# Patient Record
Sex: Male | Born: 1985 | Hispanic: No | Smoking: Never smoker
Health system: Southern US, Community
[De-identification: ages and names within clinical notes are randomized; demographics above are authoritative.]

## PROBLEM LIST (undated history)

## (undated) HISTORY — PX: EYE SURGERY: SHX253

---

## 2008-08-17 ENCOUNTER — Emergency Department (HOSPITAL_COMMUNITY): Admission: EM | Admit: 2008-08-17 | Discharge: 2008-08-17 | Payer: Self-pay | Admitting: Emergency Medicine

## 2011-07-13 LAB — RAPID URINE DRUG SCREEN, HOSP PERFORMED
Barbiturates: NOT DETECTED
Benzodiazepines: NOT DETECTED
Tetrahydrocannabinol: NOT DETECTED

## 2011-07-13 LAB — BASIC METABOLIC PANEL
BUN: 12
CO2: 19
Calcium: 9.4
Chloride: 102
GFR calc Af Amer: >60
Glucose, Bld: 156 — ABNORMAL HIGH
Potassium: 3 — ABNORMAL LOW

## 2011-07-13 LAB — POCT CARDIAC MARKERS: Troponin i, poc: <0.05

## 2012-12-03 ENCOUNTER — Ambulatory Visit (INDEPENDENT_AMBULATORY_CARE_PROVIDER_SITE_OTHER): Payer: 59 | Admitting: Family Medicine

## 2012-12-03 VITALS — BP 136/83 | HR 67 | Temp 98.5°F | Resp 16 | Ht 72.0 in | Wt 254.0 lb

## 2012-12-03 DIAGNOSIS — M25562 Pain in left knee: Secondary | ICD-10-CM

## 2012-12-03 DIAGNOSIS — M25569 Pain in unspecified knee: Secondary | ICD-10-CM

## 2012-12-03 MED ORDER — MELOXICAM 7.5 MG PO TABS: 7.5000 mg | ORAL_TABLET | Freq: Every day | ORAL | Status: DC

## 2012-12-03 NOTE — Patient Instructions (Signed)
Your knee pain is likely due to patellofemoral pain syndrome, or "runner's knee".  See the handout for exercises, and can try the meloxicam - 1 per day.  Recheck in the next few weeks if not improving for possible x ray or other evaluation. Return to the clinic or go to the nearest emergency room if any of your symptoms worsen or new symptoms occur.

## 2012-12-03 NOTE — Progress Notes (Signed)
  Subjective:    Patient ID: Stephen Hayes, male    DOB: 07-Mar-1986, 27 y.o.   MRN: [id number]  HPI Alexzavier Smaldone is a 27 y.o. male  L knee pain for past few weeks.  Some injuries with soccer about 10 years ago, but no surgery.  Heard popping at times with exteding at times.  No known recent injury, just sore at times - notes popping after sitting for awhile, ar seated in cars for awhile.  Occasional tylenol, but pain goes away on own..  Feels ok to run, but notes soreness after running.   Has felt locking sx's at times - few days ago after running - then resolved on own.   Tx: No regular use of meds - tried icy hot and wrap.   Review of Systems  Constitutional: Negative for fever and chills.  Musculoskeletal: Positive for arthralgias. Negative for joint swelling.  Skin: Negative for rash and wound.  Neurological: Negative for weakness.       Objective:   Physical Exam  Vitals reviewed. Constitutional: He is oriented to person, place, and time. He appears well-developed. No distress.  Pulmonary/Chest: Effort normal.  Musculoskeletal: Normal range of motion.       Left knee: He exhibits abnormal patellar mobility (slight j sign - noted left and right, with slight decreased VMO bulk. ). He exhibits normal range of motion, no swelling, no effusion, no ecchymosis, no deformity and normal meniscus. No medial joint line, no lateral joint line and no patellar tendon (minimal at patellar tendon. ) tenderness noted.       Legs: Neurological: He is alert and oriented to person, place, and time.  nvi distally.  Skin: Skin is warm, dry and intact. No rash noted. No erythema.  Psychiatric: He has a normal mood and affect. His behavior is normal.      Assessment & Plan:  Selso Mannor is a 27 y.o. male Patellofemoral pain syndrome, left - Plan: meloxicam (MOBIC) 7.5 MG tablet  Knee pain, left anterior - Plan: meloxicam (MOBIC) 7.5 MG tablet  Intermittent, recurrent L knee pain. Exam and sx;s  consistent with PFPS.  Will try HEP per sports med database, mobic once  Per day and recheck in next few weeks if not improving.  rtc precautions discussed.   Patient Instructions  Your knee pain is likely due to patellofemoral pain syndrome, or "runner's knee".  See the handout for exercises, and can try the meloxicam - 1 per day.  Recheck in the next few weeks if not improving for possible x ray or other evaluation. Return to the clinic or go to the nearest emergency room if any of your symptoms worsen or new symptoms occur.    Meds ordered this encounter  Medications  . meloxicam (MOBIC) 7.5 MG tablet    Sig: Take 1 tablet (7.5 mg total) by mouth daily.    Dispense:  30 tablet    Refill:  0

## 2013-01-16 ENCOUNTER — Ambulatory Visit (INDEPENDENT_AMBULATORY_CARE_PROVIDER_SITE_OTHER): Payer: 59 | Admitting: Family Medicine

## 2013-01-16 ENCOUNTER — Ambulatory Visit: Payer: 59

## 2013-01-16 VITALS — BP 132/80 | HR 71 | Temp 98.7°F | Resp 18 | Ht 70.75 in | Wt 246.0 lb

## 2013-01-16 DIAGNOSIS — M549 Dorsalgia, unspecified: Secondary | ICD-10-CM

## 2013-01-16 DIAGNOSIS — IMO0002 Reserved for concepts with insufficient information to code with codable children: Secondary | ICD-10-CM

## 2013-01-16 DIAGNOSIS — S39012A Strain of muscle, fascia and tendon of lower back, initial encounter: Secondary | ICD-10-CM

## 2013-01-16 DIAGNOSIS — M79609 Pain in unspecified limb: Secondary | ICD-10-CM

## 2013-01-16 MED ORDER — DICLOFENAC SODIUM 75 MG PO TBEC: 75.0000 mg | DELAYED_RELEASE_TABLET | Freq: Two times a day (BID) | ORAL | Status: DC

## 2013-01-16 MED ORDER — METHYLPREDNISOLONE 4 MG PO KIT: PACK | ORAL | Status: DC

## 2013-01-16 MED ORDER — CYCLOBENZAPRINE HCL 5 MG PO TABS: 5.0000 mg | ORAL_TABLET | Freq: Every evening | ORAL | Status: DC | PRN

## 2013-01-16 MED ORDER — TRAMADOL HCL 50 MG PO TABS: 50.0000 mg | ORAL_TABLET | Freq: Three times a day (TID) | ORAL | Status: DC | PRN

## 2013-01-16 NOTE — Progress Notes (Signed)
[facility]:  Office Visit  Chief Complaint:  Chief Complaint  Patient presents with  . Back Pain    lower; since wednesday    HPI: Stephen Hayes is a 27 y.o. male who complains of  1 week history of right lower back after playing basketball. Was going for a ball and may have pulled something. Tried aleve, ibuprofen, massage without relief. Has had back problems 10 years ago, had torn msk tissue. When he sits and tries to get up he has sharp pain and feels back is being pinched. When he does physical activity he feels fine, usually after the activity he has pain. Sharp 8/10 pain. No weakness, numbness, tingling.   History reviewed. No pertinent past medical history. History reviewed. No pertinent past surgical history. History   Social History  . Marital Status: Unknown    Spouse Name: N/A    Number of Children: N/A  . Years of Education: N/A   Social History Main Topics  . Smoking status: Never Smoker   . Smokeless tobacco: None  . Alcohol Use: Yes  . Drug Use: No  . Sexually Active: Yes   Other Topics Concern  . None   Social History Narrative  . None   History reviewed. No pertinent family history. No Known Allergies Prior to Admission medications   Medication Sig Start Date End Date Taking? Authorizing Provider  ibuprofen (ADVIL,MOTRIN) 200 MG tablet Take 200 mg by mouth every 6 (six) hours as needed for pain.   Yes Historical Provider, MD  meloxicam (MOBIC) 7.5 MG tablet Take 1 tablet (7.5 mg total) by mouth daily. 12/03/12   Shade Flood, MD     ROS: The patient denies fevers, chills, night sweats, unintentional weight loss, chest pain, palpitations, wheezing, dyspnea on exertion, nausea, vomiting, abdominal pain, dysuria, hematuria, melena, numbness, weakness, or tingling.   All other systems have been reviewed and were otherwise negative with the exception of those mentioned in the HPI and as above.    PHYSICAL EXAM: Filed Vitals:    01/16/13 1607  BP: 132/80  Pulse: 71  Temp: 98.7 F (37.1 C)  Resp: 18   Filed Vitals:   01/16/13 1607  Height: 5' 10.75" (1.797 m)  Weight: 246 lb (111.585 kg)   Body mass index is 34.55 kg/(m^2).  General: Alert, no acute distress HEENT:  Normocephalic, atraumatic, oropharynx patent.  Cardiovascular:  Regular rate and rhythm, no rubs murmurs or gallops.  No Carotid bruits, radial pulse intact. No pedal edema.  Respiratory: Clear to auscultation bilaterally.  No wheezes, rales, or rhonchi.  No cyanosis, no use of accessory musculature GI: No organomegaly, abdomen is soft and non-tender, positive bowel sounds.  No masses. Skin: No rashes. Neurologic: Facial musculature symmetric. Psychiatric: Patient is appropriate throughout our interaction. Lymphatic: No cervical lymphadenopathy Musculoskeletal: Gait intact. Tender right side + straight leg right side Decrease ROM in extension, SB, Rotation   LABS: Results for orders placed during the hospital encounter of 08/17/08  ETHANOL      Result Value Range   Alcohol, Ethyl (B)   (*)    Value: 13            LOWEST DETECTABLE LIMIT FOR     SERUM ALCOHOL IS 11 mg/dL     FOR MEDICAL PURPOSES ONLY  BASIC METABOLIC PANEL      Result Value Range   Sodium 135     Potassium 3.0 (*)    Chloride 102  CO2 19     Glucose, Bld 156 (*)    BUN 12     Creatinine, Ser 1.15     Calcium 9.4     GFR calc non Af Amer >60     GFR calc Af Amer       Value: >60            The eGFR has been calculated     using the MDRD equation.     This calculation has not been     validated in all clinical  URINE RAPID DRUG SCREEN (HOSP PERFORMED)      Result Value Range   Opiates NONE DETECTED     Cocaine NONE DETECTED     Benzodiazepines NONE DETECTED     Amphetamines NONE DETECTED     Tetrahydrocannabinol NONE DETECTED     Barbiturates       Value: NONE DETECTED            DRUG SCREEN FOR MEDICAL PURPOSES     ONLY.  IF CONFIRMATION IS NEEDED      FOR ANY PURPOSE, NOTIFY LAB     WITHIN 5 DAYS.  POCT CARDIAC MARKERS      Result Value Range   Myoglobin, poc 48.5     CKMB, poc <1.0 (*)    Troponin i, poc <0.05       EKG/XRAY:   Primary read interpreted by Dr. Conley Rolls at [facility]. No fx/dislocation    ASSESSMENT/PLAN: Encounter Diagnoses  Name Primary?  . Back pain Yes  . Sprain and strain of back, initial encounter    Rx Flexeril, Tramadol, Diclofenac Medrol dose pack if no better in 2 days F/u in 14 days    Zelie Asbill PHUONG, DO 01/16/2013 5:57 PM

## 2013-01-16 NOTE — Patient Instructions (Addendum)
Back Exercises Back exercises help treat and prevent back injuries. The goal is to increase your strength in your belly (abdominal) and back muscles. These exercises can also help with flexibility. Start these exercises when told by your doctor. HOME CARE Back exercises include: Pelvic Tilt.  Lie on your back with your knees bent. Tilt your pelvis until the lower part of your back is against the floor. Hold this position 5 to 10 sec. Repeat this exercise 5 to 10 times. Knee to Chest.  Pull 1 knee up against your chest and hold for 20 to 30 seconds. Repeat this with the other knee. This may be done with the other leg straight or bent, whichever feels better. Then, pull both knees up against your chest. Sit-Ups or Curl-Ups.  Bend your knees 90 degrees. Start with tilting your pelvis, and do a partial, slow sit-up. Only lift your upper half 30 to 45 degrees off the floor. Take at least 2 to 3 seonds for each sit-up. Do not do sit-ups with your knees out straight. If partial sit-ups are difficult, simply do the above but with only tightening your belly (abdominal) muscles and holding it as told. Hip-Lift.  Lie on your back with your knees flexed 90 degrees. Push down with your feet and shoulders as you raise your hips 2 inches off the floor. Hold for 10 seconds, repeat 5 to 10 times. Back Arches.  Lie on your stomach. Prop yourself up on bent elbows. Slowly press on your hands, causing an arch in your low back. Repeat 3 to 5 times. Shoulder-Lifts.  Lie face down with arms beside your body. Keep hips and belly pressed to floor as you slowly lift your head and shoulders off the floor. Do not overdo your exercises. Be careful in the beginning. Exercises may cause you some mild back discomfort. If the pain lasts for more than 15 minutes, stop the exercises until you see your doctor. Improvement with exercise for back problems is slow.  Document Released: 10/30/2010 Document Revised: 12/20/2011  Document Reviewed: 07/29/2011 [facility] Patient Information 2013 [place], [place]. Back Pain, Adult Back pain is very common. The pain often gets better over time. The cause of back pain is usually not dangerous. Most people can learn to manage their back pain on their own.  HOME CARE   Stay active. Start with short walks on flat ground if you can. Try to walk farther each day.  Do not sit, drive, or stand in one place for more than 30 minutes. Do not stay in bed.  Do not avoid exercise or work. Activity can help your back heal faster.  Be careful when you bend or lift an object. Bend at your knees, keep the object close to you, and do not twist.  Sleep on a firm mattress. Lie on your side, and bend your knees. If you lie on your back, put a pillow under your knees.  Only take medicines as told by your doctor.  Put ice on the injured area.  Put ice in a plastic bag.  Place a towel between your skin and the bag.  Leave the ice on for 15 to 20 minutes, 3 to 4 times a day for the first 2 to 3 days. After that, you can switch between ice and heat packs.  Ask your doctor about back exercises or massage.  Avoid feeling anxious or stressed. Find good ways to deal with stress, such as exercise. GET HELP RIGHT AWAY IF:   Your  pain does not go away with rest or medicine.  Your pain does not go away in 1 week.  You have new problems.  You do not feel well.  The pain spreads into your legs.  You cannot control when you poop (bowel movement) or pee (urinate).  Your arms or legs feel weak or lose feeling (numbness).  You feel sick to your stomach (nauseous) or throw up (vomit).  You have belly (abdominal) pain.  You feel like you may pass out (faint). MAKE SURE YOU:   Understand these instructions.  Will watch your condition.  Will get help right away if you are not doing well or get worse. Document Released: 03/15/2008 Document Revised: 12/20/2011 Document Reviewed:  02/15/2011 [facility] Patient Information 2013 [place], [place].

## 2013-04-04 ENCOUNTER — Ambulatory Visit (INDEPENDENT_AMBULATORY_CARE_PROVIDER_SITE_OTHER): Payer: 59 | Admitting: Physician Assistant

## 2013-04-04 VITALS — BP 120/80 | HR 94 | Temp 99.9°F | Resp 16 | Ht 73.0 in | Wt 242.6 lb

## 2013-04-04 DIAGNOSIS — J029 Acute pharyngitis, unspecified: Secondary | ICD-10-CM

## 2013-04-04 DIAGNOSIS — R509 Fever, unspecified: Secondary | ICD-10-CM

## 2013-04-04 LAB — POCT RAPID STREP A (OFFICE): Rapid Strep A Screen: NEGATIVE

## 2013-04-04 MED ORDER — AMOXICILLIN 875 MG PO TABS: 875.0000 mg | ORAL_TABLET | Freq: Two times a day (BID) | ORAL | Status: DC

## 2013-04-04 MED ORDER — FIRST-DUKES MOUTHWASH MT SUSP: 5.0000 mL | OROMUCOSAL | Status: DC | PRN

## 2013-04-04 NOTE — Progress Notes (Signed)
  Subjective:    Patient ID: Stephen Hayes, male    DOB: [birth year], 27 y.o.   MRN: [id number]  HPI 27 year old male presents with 3 day history of sore throat that has progressively gotten worse over the last 24-48 hours.  Complains of chills, weakness, and body aches.  Denies nasal congestion, cough, headache, otalgia, emesis, or SOB, or wheezing.  Has had slight nausea and decreased appetite but no emesis.  Complains of pain with swallowing but is able to swallow.  Patient is otherwise healthy with no other concerns today .    Review of Systems  Constitutional: Positive for fever and chills.  HENT: Positive for sore throat. Negative for ear pain, congestion, rhinorrhea, trouble swallowing and postnasal drip.   Respiratory: Negative for cough.   Gastrointestinal: Negative for nausea, vomiting and abdominal pain.  Neurological: Negative for dizziness and headaches.       Objective:   Physical Exam  Constitutional: He is oriented to person, place, and time. He appears well-developed and well-nourished.  HENT:  Head: Normocephalic and atraumatic.  Right Ear: Hearing, tympanic membrane, external ear and ear canal normal.  Left Ear: Hearing, tympanic membrane, external ear and ear canal normal.  Mouth/Throat: Uvula is midline and mucous membranes are normal. Oropharyngeal exudate and posterior oropharyngeal erythema present. No posterior oropharyngeal edema (2+ tonsillar swelling bilaterally).  Eyes: Conjunctivae are normal.  Neck: Normal range of motion. Neck supple.  Cardiovascular: Normal rate, regular rhythm and normal heart sounds.   Pulmonary/Chest: Effort normal and breath sounds normal.  Lymphadenopathy:    He has no cervical adenopathy.  Neurological: He is alert and oriented to person, place, and time.  Psychiatric: He has a normal mood and affect. His behavior is normal. Judgment and thought content normal.    Results for orders placed in visit on 04/04/13  POCT RAPID STREP  A (OFFICE)      Result Value Range   Rapid Strep A Screen Negative  Negative        Assessment & Plan:  Acute pharyngitis - Plan: POCT rapid strep A, Culture, Group A Strep, amoxicillin (AMOXIL) 875 MG tablet, Diphenhyd-Hydrocort-Nystatin (FIRST-DUKES MOUTHWASH) SUSP  Fever, unspecified Throat culture Despite negative strep today, I am highly suspicious of strep pharyngitis Start amoxicillin 875 mg bid x 10 days Out of work today. Ok to go back tomorrow if afebrile Increase fluids and rest Follow up if symptoms worsen or fail to improve.

## 2013-04-06 LAB — CULTURE, GROUP A STREP: Organism ID, Bacteria: NORMAL

## 2014-01-01 ENCOUNTER — Other Ambulatory Visit: Payer: Self-pay | Admitting: Internal Medicine

## 2014-01-01 MED ORDER — SIMETHICONE 40 MG/0.6ML PO SUSP: 40.0000 mg | Freq: Every day | ORAL | Status: DC

## 2014-06-11 IMAGING — CR DG LUMBAR SPINE 2-3V
2 series · 2 of 2 positions shown · non-contrast
Comparison: None.

CLINICAL DATA: Low back pain

LUMBAR SPINE - 2-3 VIEW

[AP]
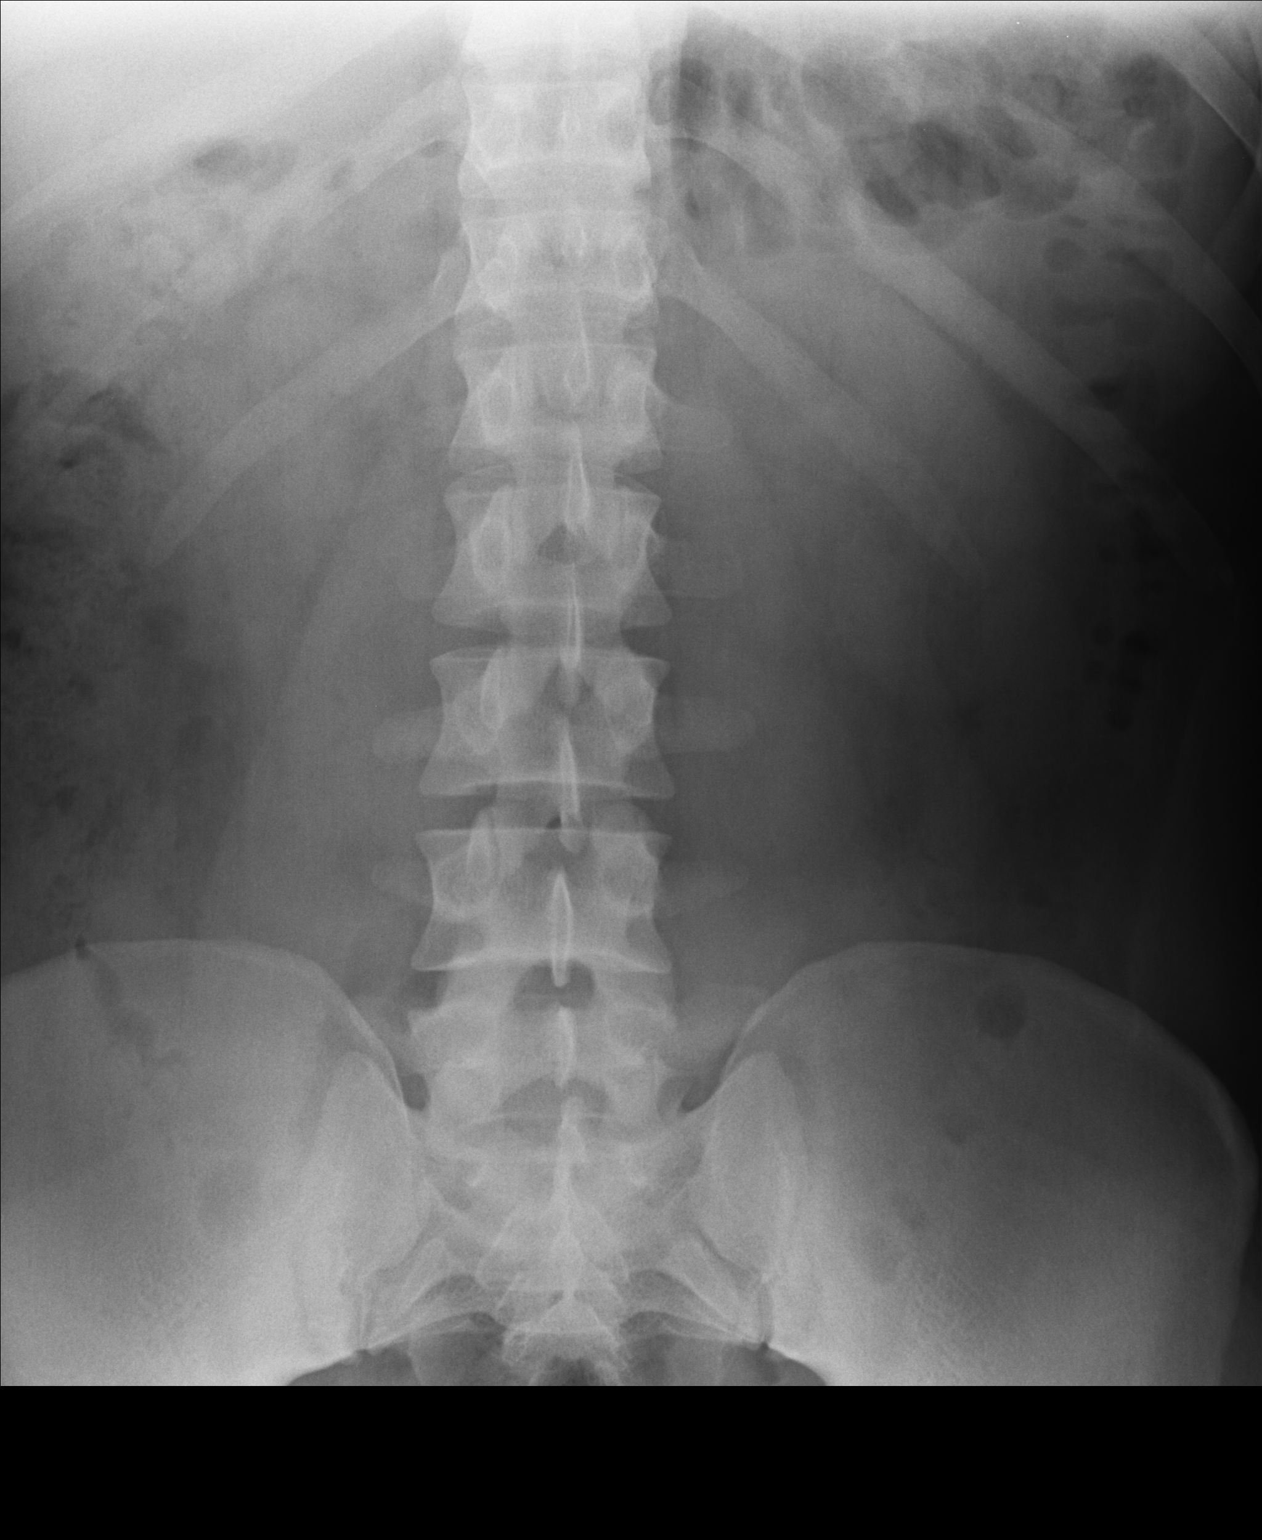

[lateral]
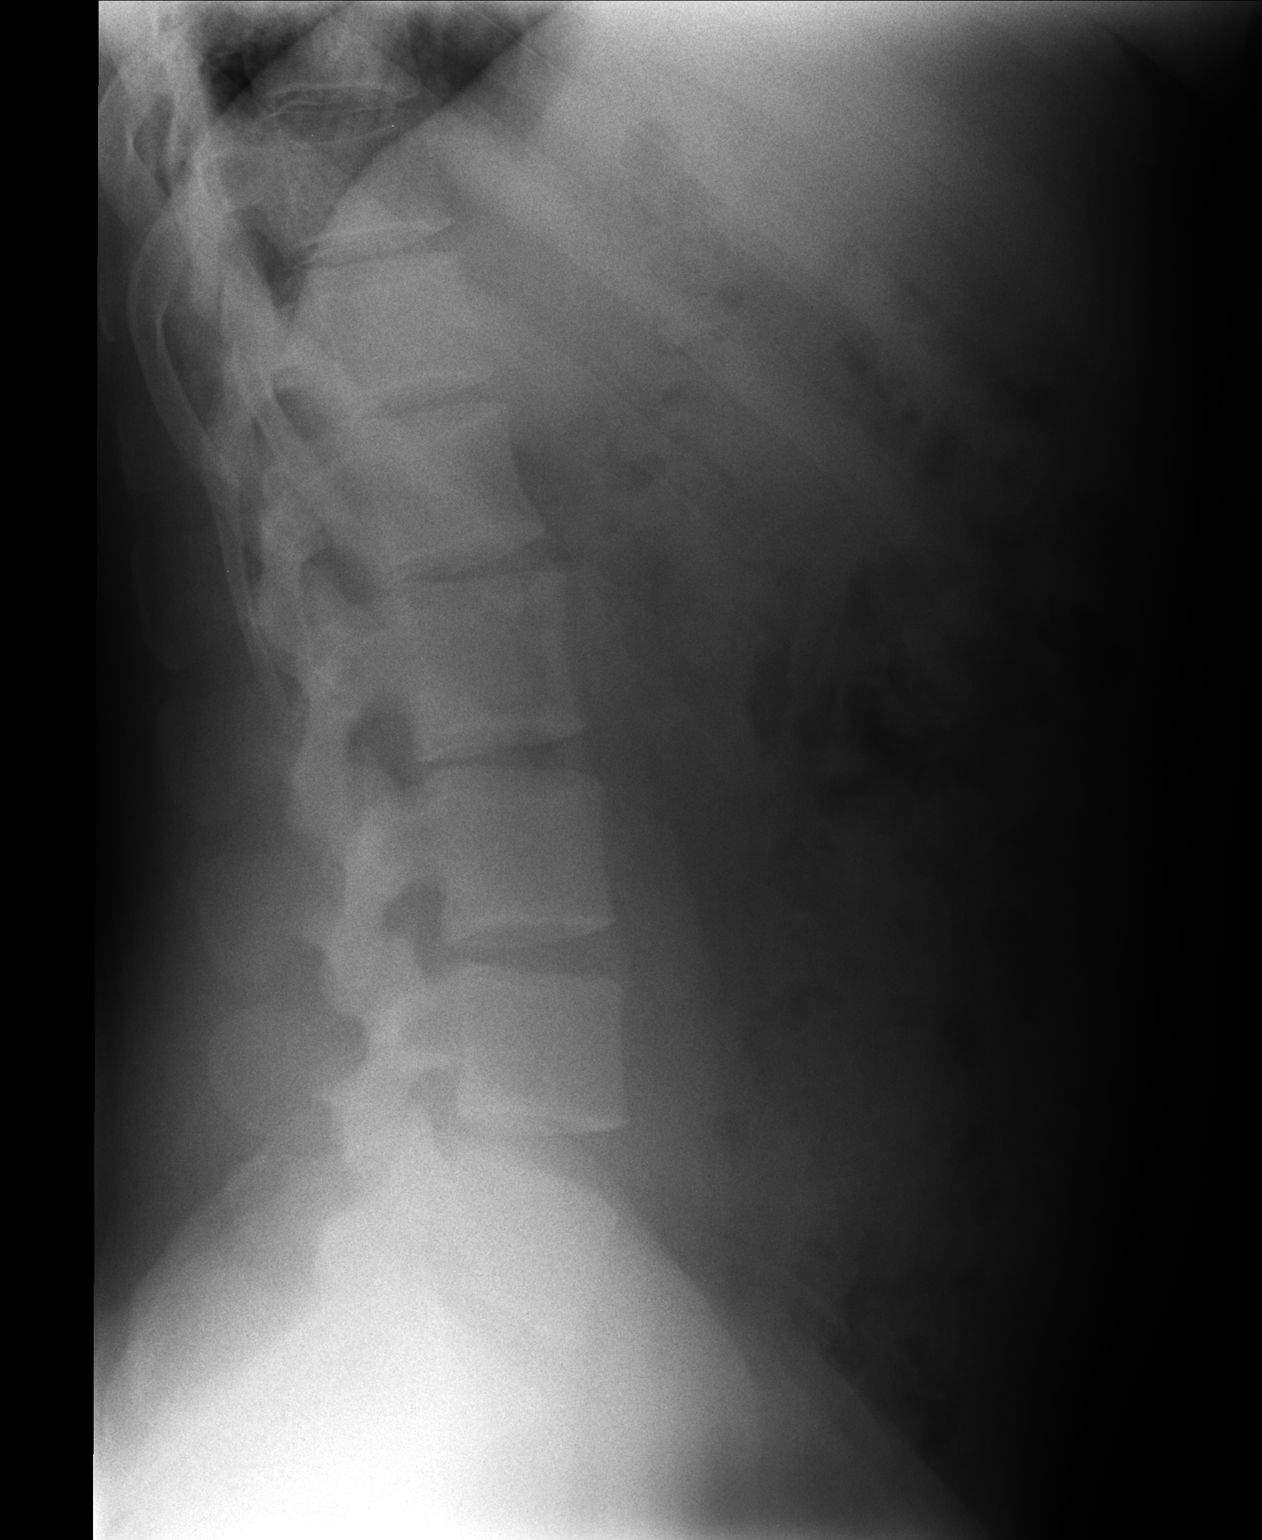

[2 of 2 positions shown; findings below may reference images not displayed]

FINDINGS: Normal alignment.  Preserved vertebral body heights and
disc spaces.  Intact pedicles and normal SI joints.  Nonobstructive
bowel gas pattern.
IMPRESSION: No acute finding

## 2014-06-20 ENCOUNTER — Ambulatory Visit (INDEPENDENT_AMBULATORY_CARE_PROVIDER_SITE_OTHER): Payer: 59 | Admitting: Physician Assistant

## 2014-06-20 VITALS — BP 120/72 | HR 63 | Temp 99.1°F | Resp 16 | Ht 73.0 in | Wt 247.0 lb

## 2014-06-20 DIAGNOSIS — L255 Unspecified contact dermatitis due to plants, except food: Secondary | ICD-10-CM

## 2014-06-20 MED ORDER — BETAMETHASONE DIPROPIONATE AUG 0.05 % EX LOTN: TOPICAL_LOTION | Freq: Two times a day (BID) | CUTANEOUS | Status: AC

## 2014-06-20 MED ORDER — CETIRIZINE HCL 10 MG PO TABS: 10.0000 mg | ORAL_TABLET | Freq: Every day | ORAL | Status: AC

## 2014-06-20 MED ORDER — HYDROXYZINE HCL 25 MG PO TABS: 25.0000 mg | ORAL_TABLET | Freq: Three times a day (TID) | ORAL | Status: AC | PRN

## 2014-06-20 NOTE — Progress Notes (Signed)
   Subjective:    Patient ID: Stephen Hayes, male    DOB: 1986-01-21, 28 y.o.   MRN: [id number]  HPI Pt presents to clinic with poison ivy on his lower legs after getting into the woods while chasing a soccer ball - about 2 days later he developed an itchy rash on his legs that has not gotten worse. He has used OTC hydrocortisone cream but that is not helping much.    Review of Systems     Objective:   Physical Exam  Vitals reviewed. Constitutional: He is oriented to person, place, and time. He appears well-developed and well-nourished.  HENT:  Head: Normocephalic and atraumatic.  Right Ear: External ear normal.  Left Ear: External ear normal.  Pulmonary/Chest: Effort normal.  Neurological: He is alert and oriented to person, place, and time.  Skin: Skin is warm and dry. Rash (vesicular rash on his lower legs) noted.     Psychiatric: He has a normal mood and affect. His behavior is normal. Judgment and thought content normal.          Assessment & Plan:
